# Patient Record
Sex: Female | Born: 1965 | Race: Black or African American | Hispanic: No | Marital: Single | Smoking: Current some day smoker
Health system: Southern US, Community
[De-identification: ages and names within clinical notes are randomized; demographics above are authoritative.]

---

## 2001-04-26 ENCOUNTER — Emergency Department (HOSPITAL_COMMUNITY): Admission: EM | Admit: 2001-04-26 | Discharge: 2001-04-26 | Payer: Self-pay | Admitting: Emergency Medicine

## 2001-10-27 ENCOUNTER — Emergency Department (HOSPITAL_COMMUNITY): Admission: EM | Admit: 2001-10-27 | Discharge: 2001-10-27 | Payer: Self-pay | Admitting: Emergency Medicine

## 2001-10-29 ENCOUNTER — Emergency Department (HOSPITAL_COMMUNITY): Admission: EM | Admit: 2001-10-29 | Discharge: 2001-10-29 | Payer: Self-pay | Admitting: Emergency Medicine

## 2002-01-12 ENCOUNTER — Encounter: Payer: Self-pay | Admitting: Family Medicine

## 2002-01-12 ENCOUNTER — Ambulatory Visit (HOSPITAL_COMMUNITY): Admission: RE | Admit: 2002-01-12 | Discharge: 2002-01-12 | Payer: Self-pay | Admitting: Family Medicine

## 2002-02-06 ENCOUNTER — Encounter: Payer: Self-pay | Admitting: Family Medicine

## 2002-02-06 ENCOUNTER — Ambulatory Visit (HOSPITAL_COMMUNITY): Admission: RE | Admit: 2002-02-06 | Discharge: 2002-02-06 | Payer: Self-pay | Admitting: Family Medicine

## 2002-05-21 ENCOUNTER — Emergency Department (HOSPITAL_COMMUNITY): Admission: EM | Admit: 2002-05-21 | Discharge: 2002-05-21 | Payer: Self-pay | Admitting: Emergency Medicine

## 2002-07-03 ENCOUNTER — Ambulatory Visit (HOSPITAL_COMMUNITY): Admission: RE | Admit: 2002-07-03 | Discharge: 2002-07-03 | Payer: Self-pay | Admitting: Family Medicine

## 2002-07-03 ENCOUNTER — Encounter: Payer: Self-pay | Admitting: Family Medicine

## 2003-10-29 ENCOUNTER — Emergency Department (HOSPITAL_COMMUNITY): Admission: EM | Admit: 2003-10-29 | Discharge: 2003-10-29 | Payer: Self-pay | Admitting: Emergency Medicine

## 2006-02-18 ENCOUNTER — Emergency Department (HOSPITAL_COMMUNITY): Admission: EM | Admit: 2006-02-18 | Discharge: 2006-02-18 | Payer: Self-pay | Admitting: Emergency Medicine

## 2010-07-13 ENCOUNTER — Encounter: Payer: Self-pay | Admitting: Family Medicine

## 2010-08-11 ENCOUNTER — Other Ambulatory Visit (HOSPITAL_COMMUNITY): Payer: Self-pay | Admitting: Family Medicine

## 2010-08-11 DIAGNOSIS — Z139 Encounter for screening, unspecified: Secondary | ICD-10-CM

## 2010-08-12 ENCOUNTER — Encounter (HOSPITAL_COMMUNITY): Payer: Self-pay

## 2013-04-22 ENCOUNTER — Encounter (HOSPITAL_COMMUNITY): Payer: Self-pay | Admitting: Emergency Medicine

## 2013-04-22 ENCOUNTER — Emergency Department (INDEPENDENT_AMBULATORY_CARE_PROVIDER_SITE_OTHER)
Admission: EM | Admit: 2013-04-22 | Discharge: 2013-04-22 | Disposition: A | Discharge status: Home / Self Care | Payer: Self-pay | Source: Home / Self Care | Attending: Emergency Medicine | Admitting: Emergency Medicine

## 2013-04-22 DIAGNOSIS — S39012A Strain of muscle, fascia and tendon of lower back, initial encounter: Secondary | ICD-10-CM

## 2013-04-22 DIAGNOSIS — S161XXA Strain of muscle, fascia and tendon at neck level, initial encounter: Secondary | ICD-10-CM

## 2013-04-22 DIAGNOSIS — S139XXA Sprain of joints and ligaments of unspecified parts of neck, initial encounter: Secondary | ICD-10-CM

## 2013-04-22 MED ORDER — HYDROCODONE-ACETAMINOPHEN 5-325 MG PO TABS: ORAL_TABLET | ORAL | Status: DC

## 2013-04-22 MED ORDER — METHOCARBAMOL 500 MG PO TABS: 500.0000 mg | ORAL_TABLET | Freq: Three times a day (TID) | ORAL | Status: DC

## 2013-04-22 MED ORDER — MELOXICAM 15 MG PO TABS: 15.0000 mg | ORAL_TABLET | Freq: Every day | ORAL | Status: DC
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
## 2013-04-22 NOTE — ED Notes (Signed)
Cervical Collar Applied

## 2015-11-14 ENCOUNTER — Encounter: Payer: Self-pay | Admitting: Family Medicine

## 2015-11-15 ENCOUNTER — Encounter: Payer: Self-pay | Admitting: Family Medicine

## 2015-11-15 ENCOUNTER — Ambulatory Visit (INDEPENDENT_AMBULATORY_CARE_PROVIDER_SITE_OTHER): Payer: BLUE CROSS/BLUE SHIELD | Admitting: Family Medicine

## 2015-11-15 VITALS — BP 110/64 | HR 70 | Temp 97.9°F | Resp 14 | Ht 64.0 in | Wt 164.0 lb

## 2015-11-15 DIAGNOSIS — S161XXA Strain of muscle, fascia and tendon at neck level, initial encounter: Secondary | ICD-10-CM

## 2015-11-15 DIAGNOSIS — Z0000 Encounter for general adult medical examination without abnormal findings: Secondary | ICD-10-CM

## 2015-11-15 LAB — LIPID PANEL
CHOL/HDL RATIO: 4.7 ratio (ref <=5.0)
Cholesterol: 200 mg/dL (ref 125–200)
HDL: 43 mg/dL — ABNORMAL LOW (ref >=46)
LDL Cholesterol: 139 mg/dL — ABNORMAL HIGH (ref <130)
Triglycerides: 90 mg/dL (ref <150)
VLDL: 18 mg/dL (ref <30)

## 2015-11-15 LAB — COMPREHENSIVE METABOLIC PANEL
ALK PHOS: 61 U/L (ref 33–130)
ALT: 9 U/L (ref 6–29)
AST: 15 U/L (ref 10–35)
Albumin: 4.3 g/dL (ref 3.6–5.1)
BUN: 18 mg/dL (ref 7–25)
CHLORIDE: 105 mmol/L (ref 98–110)
CO2: 27 mmol/L (ref 20–31)
Calcium: 9.1 mg/dL (ref 8.6–10.4)
Creat: 0.88 mg/dL (ref 0.50–1.05)
GLUCOSE: 88 mg/dL (ref 70–99)
Potassium: 4.5 mmol/L (ref 3.5–5.3)
Sodium: 140 mmol/L (ref 135–146)
Total Bilirubin: 0.4 mg/dL (ref 0.2–1.2)
Total Protein: 6.7 g/dL (ref 6.1–8.1)

## 2015-11-15 LAB — CBC WITH DIFFERENTIAL/PLATELET
BASOS PCT: 0 %
Basophils Absolute: 0 cells/uL (ref 0–200)
EOS PCT: 3 %
Eosinophils Absolute: 216 cells/uL (ref 15–500)
HCT: 41 % (ref 35.0–45.0)
Hemoglobin: 13.6 g/dL (ref 12.0–15.0)
Lymphocytes Relative: 32 %
Lymphs Abs: 2304 cells/uL (ref 850–3900)
MCH: 30.7 pg (ref 27.0–33.0)
MCHC: 33.2 g/dL (ref 32.0–36.0)
MCV: 92.6 fL (ref 80.0–100.0)
MONOS PCT: 9 %
MPV: 10.1 fL (ref 7.5–12.5)
Monocytes Absolute: 648 cells/uL (ref 200–950)
Neutro Abs: 4032 cells/uL (ref 1500–7800)
Neutrophils Relative %: 56 %
PLATELETS: 250 10*3/uL (ref 140–400)
RBC: 4.43 MIL/uL (ref 3.80–5.10)
RDW: 13.5 % (ref 11.0–15.0)
WBC: 7.2 10*3/uL (ref 3.8–10.8)

## 2015-11-15 LAB — TSH: TSH: 0.65 m[IU]/L

## 2015-11-15 MED ORDER — CYCLOBENZAPRINE HCL 5 MG PO TABS: 5.0000 mg | ORAL_TABLET | Freq: Every day | ORAL | Status: DC

## 2015-11-15 NOTE — Progress Notes (Signed)
Patient ID: Kayla JamesWanda W Woods, female   DOB: Jul 05, 1965, 50 y.o.   MRN: [id number]   Subjective:    Patient ID: Kayla JamesWanda W Woods, female    DOB: Jul 05, 1965, 50 y.o.   MRN: [id number]  Patient presents for New Patient~ Establish Care  Pt here for CPE to establish care Previous PCP Dr. Sudie BaileyKnowlton  No known past medical history, no meds She has had mild headache and neck discomfort, after she had some N/V illness she had HA, HA resolved now has some pain in side of neck, it resolves with motrin. She denies tingling numbness of fingertips, no injury to neck Note does not like prescription meds She gets low back pain/spasm from repetitive movements at work, no radiation  Overdue PAP, MAMMO, COLONOSCOPY, Declines immunizations States TDAP in 2008  She does smoke marijuana regulary feels this helps her mood, states all her family members are on mental pills and rather smokes  Lives along 2 adult children     Review Of Systems:  GEN- denies fatigue, fever, weight loss,weakness, recent illness HEENT- denies eye drainage, change in vision, nasal discharge, CVS- denies chest pain, palpitations RESP- denies SOB, cough, wheeze ABD- denies N/V, change in stools, abd pain GU- denies dysuria, hematuria, dribbling, incontinence MSK- + joint pain, muscle aches, injury Neuro- denies headache, dizziness, syncope, seizure activity       Objective:    BP 110/64 mmHg  Pulse 70  Temp(Src) 97.9 F (36.6 C) (Oral)  Resp 14  Ht 5\' 4"  (1.626 m)  Wt 164 lb (74.39 kg)  BMI 28.14 kg/m2  LMP 08/18/2015 GEN- NAD, alert and oriented x3 HEENT- PERRL, EOMI, non injected sclera, pink conjunctiva, MMM, oropharynx clear Neck- Supple, no thyromegaly, C spine NT, mild TTP along SCM region right side, FROM neck, neg spurlings  CVS- RRR, no murmur RESP-CTAB ABD-NABS,soft,NT,ND MSK- Lumbar spine NT, no spasm, good ROM, neg SLR  Strength equal bilat upper and lower ext  Psych- normal affect and mood  EXT- No  edema Pulses- Radial, DP- 2+        Assessment & Plan:      Problem List Items Addressed This Visit    None    Visit Diagnoses    Routine general medical examination at a health care facility    -  Primary    CPE done, fasting labs today, return for PAP Smear, she will do cologuard. Discussed need to cut out marijuana use     Relevant Orders    CBC with Differential/Platelet (Completed)    Comprehensive metabolic panel (Completed)    Lipid panel (Completed)    TSH (Completed)    Neck strain, initial encounter        more MSK strain baed on exam, okay to use NSAIDS, also given muscle relaxer       Note: This dictation was prepared with Dragon dictation along with smaller phrase technology. Any transcriptional errors that result from this process are unintentional.

## 2015-11-15 NOTE — Patient Instructions (Addendum)
I recommend eye visit once a year I recommend dental visit every 6 months Goal is to  Exercise 30 minutes 5 days a week We will send a letter with lab results  Release of records Dr. Sudie BaileyKnowlton  Schedule PAP Smear within the next 2 months  Use the flexeril as needed

## 2015-11-22 ENCOUNTER — Encounter: Payer: Self-pay | Admitting: *Deleted

## 2015-12-12 LAB — COLOGUARD: Cologuard: NEGATIVE

## 2016-01-15 ENCOUNTER — Encounter: Payer: BLUE CROSS/BLUE SHIELD | Admitting: Family Medicine

## 2016-08-06 ENCOUNTER — Encounter (HOSPITAL_COMMUNITY): Payer: Self-pay | Admitting: Family Medicine

## 2016-08-06 ENCOUNTER — Ambulatory Visit (HOSPITAL_COMMUNITY)
Admission: EM | Admit: 2016-08-06 | Discharge: 2016-08-06 | Disposition: A | Discharge status: Home / Self Care | Payer: BLUE CROSS/BLUE SHIELD | Attending: Internal Medicine | Admitting: Internal Medicine

## 2016-08-06 DIAGNOSIS — K529 Noninfective gastroenteritis and colitis, unspecified: Secondary | ICD-10-CM

## 2016-08-06 DIAGNOSIS — R112 Nausea with vomiting, unspecified: Secondary | ICD-10-CM

## 2016-08-06 DIAGNOSIS — R197 Diarrhea, unspecified: Secondary | ICD-10-CM | POA: Diagnosis not present

## 2016-08-06 MED ORDER — ONDANSETRON 4 MG PO TBDP: 4.0000 mg | ORAL_TABLET | Freq: Three times a day (TID) | ORAL | 0 refills | Status: DC | PRN

## 2016-08-06 MED ORDER — ONDANSETRON 4 MG PO TBDP
4.0000 mg | ORAL_TABLET | Freq: Once | ORAL | Status: AC
  Administered 2016-08-06: 4 mg via ORAL

## 2016-08-06 MED ORDER — ONDANSETRON 4 MG PO TBDP
ORAL_TABLET | ORAL | Status: AC
  Filled 2016-08-06: qty 1

## 2016-08-06 NOTE — ED Triage Notes (Signed)
Pt here for N,V,D that started around midnight.

## 2016-08-06 NOTE — ED Provider Notes (Signed)
CSN: [id number]     Arrival date & time 08/06/16  1134 History   First MD Initiated Contact with Patient 08/06/16 1229     Chief Complaint  Patient presents with  . Emesis  . Diarrhea   (Consider location/radiation/quality/duration/timing/severity/associated sxs/prior Treatment) Patient c/o NVD since last night and c/o weakness.     The history is provided by the patient.  Emesis  Severity:  Moderate Duration:  12 hours Timing:  Constant Number of daily episodes:  2 Quality:  Stomach contents Able to tolerate:  Liquids How soon after eating does vomiting occur:  5 minutes Progression:  Worsening Chronicity:  New Recent urination:  Normal Relieved by:  Nothing Worsened by:  Nothing Ineffective treatments:  None tried Associated symptoms: diarrhea   Diarrhea  Associated symptoms: vomiting     History reviewed. No pertinent past medical history. History reviewed. No pertinent surgical history. Family History  Problem Relation Age of Onset  . Mental illness Sister     Bipolar   . Mental illness Father     commited suicide  . Heart disease Brother   . Diabetes Maternal Grandfather   . Hypertension Maternal Grandfather   . Multiple sclerosis Daughter    Social History  Substance Use Topics  . Smoking status: Current Some Day Smoker    Types: Cigarettes  . Smokeless tobacco: Never Used  . Alcohol use Yes     Comment: occasionally   OB History    No data available     Review of Systems  Constitutional: Positive for fatigue.  HENT: Negative.   Eyes: Negative.   Respiratory: Negative.   Cardiovascular: Negative.   Gastrointestinal: Positive for diarrhea and vomiting.  Endocrine: Negative.   Genitourinary: Negative.   Musculoskeletal: Negative.   Allergic/Immunologic: Negative.   Neurological: Negative.   Hematological: Negative.     Allergies  Patient has no known allergies.  Home Medications   Prior to Admission medications   Medication Sig Start  Date End Date Taking? Authorizing Provider  cyclobenzaprine (FLEXERIL) 5 MG tablet Take 1 tablet (5 mg total) by mouth at bedtime. 11/15/15   Salley Scarlet, MD  ibuprofen (ADVIL,MOTRIN) 200 MG tablet Take 400 mg by mouth every 6 (six) hours as needed.    Historical Provider, MD  ondansetron (ZOFRAN ODT) 4 MG disintegrating tablet Take 1 tablet (4 mg total) by mouth every 8 (eight) hours as needed for nausea or vomiting. 08/06/16   Deatra Canter, FNP   Meds Ordered and Administered this Visit   Medications  ondansetron (ZOFRAN-ODT) disintegrating tablet 4 mg (4 mg Oral Given 08/06/16 1233)    BP 128/77   Pulse 68   Temp 97.5 F (36.4 C)   Resp 18   SpO2 98%  No data found.   Physical Exam  Constitutional: She appears well-developed and well-nourished.  HENT:  Head: Normocephalic.  Right Ear: External ear normal.  Left Ear: External ear normal.  Mouth/Throat: Oropharynx is clear and moist.  Eyes: Conjunctivae and EOM are normal. Pupils are equal, round, and reactive to light.  Neck: Normal range of motion. Neck supple.  Cardiovascular: Normal rate, regular rhythm and normal heart sounds.   Pulmonary/Chest: Effort normal and breath sounds normal.  Abdominal: Soft. Bowel sounds are normal.  Nursing note and vitals reviewed.   Urgent Care Course     Procedures (including critical care time)  Labs Review Labs Reviewed - No data to display  Imaging Review No results found.   Visual  Acuity Review  Right Eye Distance:   Left Eye Distance:   Bilateral Distance:    Right Eye Near:   Left Eye Near:    Bilateral Near:         MDM   1. Nausea vomiting and diarrhea   2. Acute gastroenteritis    Zofran ODT 4mg  here in clinic Zofran ODT 4mg  one po tid prn #21 Push po fluids, rest, tylenol and motrin otc prn as directed for fever, arthralgias, and myalgias.  Follow up prn if sx's continue or persist.    Deatra CanterWilliam J Saisha Hogue, FNP 08/06/16 1324

## 2016-08-07 ENCOUNTER — Ambulatory Visit (HOSPITAL_COMMUNITY)
Admission: EM | Admit: 2016-08-07 | Discharge: 2016-08-07 | Discharge status: Absent without leave | Payer: BLUE CROSS/BLUE SHIELD

## 2016-08-07 ENCOUNTER — Encounter (HOSPITAL_COMMUNITY): Payer: Self-pay | Admitting: Nurse Practitioner

## 2016-08-07 ENCOUNTER — Emergency Department (HOSPITAL_COMMUNITY): Payer: BLUE CROSS/BLUE SHIELD

## 2016-08-07 ENCOUNTER — Emergency Department (HOSPITAL_COMMUNITY)
Admission: EM | Admit: 2016-08-07 | Discharge: 2016-08-07 | Disposition: A | Discharge status: Home / Self Care | Payer: BLUE CROSS/BLUE SHIELD | Attending: Emergency Medicine | Admitting: Emergency Medicine

## 2016-08-07 DIAGNOSIS — F17210 Nicotine dependence, cigarettes, uncomplicated: Secondary | ICD-10-CM | POA: Diagnosis not present

## 2016-08-07 DIAGNOSIS — R109 Unspecified abdominal pain: Secondary | ICD-10-CM | POA: Diagnosis present

## 2016-08-07 DIAGNOSIS — K529 Noninfective gastroenteritis and colitis, unspecified: Secondary | ICD-10-CM | POA: Diagnosis not present

## 2016-08-07 LAB — COMPREHENSIVE METABOLIC PANEL
ALK PHOS: 73 U/L (ref 38–126)
ALT: 23 U/L (ref 14–54)
AST: 42 U/L — AB (ref 15–41)
Albumin: 4.7 g/dL (ref 3.5–5.0)
Anion gap: 15 (ref 5–15)
BILIRUBIN TOTAL: 1 mg/dL (ref 0.3–1.2)
BUN: 15 mg/dL (ref 6–20)
CO2: 21 mmol/L — ABNORMAL LOW (ref 22–32)
Calcium: 9.9 mg/dL (ref 8.9–10.3)
Chloride: 100 mmol/L — ABNORMAL LOW (ref 101–111)
Creatinine, Ser: 0.75 mg/dL (ref 0.44–1.00)
GFR calc Af Amer: >60 mL/min (ref >60)
GLUCOSE: 103 mg/dL — AB (ref 65–99)
Potassium: 3.5 mmol/L (ref 3.5–5.1)
Sodium: 136 mmol/L (ref 135–145)
TOTAL PROTEIN: 8 g/dL (ref 6.5–8.1)

## 2016-08-07 LAB — URINALYSIS, ROUTINE W REFLEX MICROSCOPIC
Bilirubin Urine: NEGATIVE
GLUCOSE, UA: 50 mg/dL — AB
Ketones, ur: 20 mg/dL — AB
NITRITE: NEGATIVE
PH: 5 (ref 5.0–8.0)
Protein, ur: 100 mg/dL — AB
SPECIFIC GRAVITY, URINE: 1.031 — AB (ref 1.005–1.030)

## 2016-08-07 LAB — CBC
HCT: 46.6 % — ABNORMAL HIGH (ref 36.0–46.0)
Hemoglobin: 16.1 g/dL — ABNORMAL HIGH (ref 12.0–15.0)
MCH: 31.9 pg (ref 26.0–34.0)
MCHC: 34.5 g/dL (ref 30.0–36.0)
MCV: 92.3 fL (ref 78.0–100.0)
PLATELETS: 245 10*3/uL (ref 150–400)
RBC: 5.05 MIL/uL (ref 3.87–5.11)
RDW: 13.5 % (ref 11.5–15.5)
WBC: 10.1 10*3/uL (ref 4.0–10.5)

## 2016-08-07 LAB — LIPASE, BLOOD: Lipase: 62 U/L — ABNORMAL HIGH (ref 11–51)

## 2016-08-07 MED ORDER — METRONIDAZOLE 500 MG PO TABS: 500.0000 mg | ORAL_TABLET | Freq: Two times a day (BID) | ORAL | 0 refills | Status: DC

## 2016-08-07 MED ORDER — SODIUM CHLORIDE 0.9 % IV SOLN
INTRAVENOUS | Status: DC
  Administered 2016-08-07: 20:00:00 via INTRAVENOUS

## 2016-08-07 MED ORDER — HYDROCODONE-ACETAMINOPHEN 5-325 MG PO TABS: 2.0000 | ORAL_TABLET | ORAL | 0 refills | Status: DC | PRN

## 2016-08-07 MED ORDER — IOPAMIDOL (ISOVUE-300) INJECTION 61%
INTRAVENOUS | Status: AC
  Administered 2016-08-07: 100 mL
  Filled 2016-08-07: qty 100

## 2016-08-07 MED ORDER — CIPROFLOXACIN HCL 500 MG PO TABS: 500.0000 mg | ORAL_TABLET | Freq: Two times a day (BID) | ORAL | 0 refills | Status: DC

## 2016-08-07 NOTE — ED Notes (Signed)
Pt is refusing blood work at this time. She says she just wants a shot for the pain and would prefer to see the doctor before blood work is drawn.

## 2016-08-07 NOTE — ED Notes (Signed)
Pt departed in NAD.  

## 2016-08-07 NOTE — ED Notes (Signed)
Patient transported to CT 

## 2016-08-07 NOTE — ED Triage Notes (Signed)
Pt presents with c/o abd pain. The pain began on Wednesday night. She describes as an irritation in her LLQ. She reports sweats, chills, nausea, vomiting, decreased urinary output. She denies constipation or diarrhea. [facility] and given nausea medication which relieved her nausea but she is still having the pain.

## 2016-08-07 NOTE — ED Provider Notes (Signed)
[facility] Provider Note   CSN: [id number] Arrival date & time: 08/07/16  1605     History   Chief Complaint Chief Complaint  Patient presents with  . Abdominal Pain    HPI Kayla Woods is a 51 y.o. female.  51 year old female presents with several days of left lower quadrant abdominal pain. Seen at urgent care and diagnosed with gastroenteritis but she continues to note sharp pain that radiates to her pelvis without vaginal bleeding or discharge. Denies any urinary symptoms. She's had slight diarrhea but denies any current vomiting or fever or chills. Pain is been persistent and worsening ambulation better with rest. No treatment use prior to arrival.      History reviewed. No pertinent past medical history.  There are no active problems to display for this patient.   History reviewed. No pertinent surgical history.  OB History    No data available       Home Medications    Prior to Admission medications   Medication Sig Start Date End Date Taking? Authorizing Provider  cyclobenzaprine (FLEXERIL) 5 MG tablet Take 1 tablet (5 mg total) by mouth at bedtime. 11/15/15   Salley Scarlet, MD  ibuprofen (ADVIL,MOTRIN) 200 MG tablet Take 400 mg by mouth every 6 (six) hours as needed.    Historical Provider, MD  ondansetron (ZOFRAN ODT) 4 MG disintegrating tablet Take 1 tablet (4 mg total) by mouth every 8 (eight) hours as needed for nausea or vomiting. 08/06/16   Deatra Canter, FNP    Family History Family History  Problem Relation Age of Onset  . Mental illness Sister     Bipolar   . Mental illness Father     commited suicide  . Heart disease Brother   . Diabetes Maternal Grandfather   . Hypertension Maternal Grandfather   . Multiple sclerosis Daughter     Social History Social History  Substance Use Topics  . Smoking status: Current Some Day Smoker    Types: Cigarettes  . Smokeless tobacco: Never Used  . Alcohol use Yes     Comment:  occasionally     Allergies   Patient has no known allergies.   Review of Systems Review of Systems  All other systems reviewed and are negative.    Physical Exam Updated Vital Signs BP 141/75   Pulse 63   Temp 98.6 F (37 C) (Oral)   Resp 16   SpO2 100%   Physical Exam  Constitutional: She is oriented to person, place, and time. She appears well-developed and well-nourished.  Non-toxic appearance. No distress.  HENT:  Head: Normocephalic and atraumatic.  Eyes: Conjunctivae, EOM and lids are normal. Pupils are equal, round, and reactive to light.  Neck: Normal range of motion. Neck supple. No tracheal deviation present. No thyroid mass present.  Cardiovascular: Normal rate, regular rhythm and normal heart sounds.  Exam reveals no gallop.   No murmur heard. Pulmonary/Chest: Effort normal and breath sounds normal. No stridor. No respiratory distress. She has no decreased breath sounds. She has no wheezes. She has no rhonchi. She has no rales.  Abdominal: Soft. Normal appearance and bowel sounds are normal. She exhibits no distension. There is tenderness in the left lower quadrant. There is no rebound and no CVA tenderness.    Musculoskeletal: Normal range of motion. She exhibits no edema or tenderness.  Neurological: She is alert and oriented to person, place, and time. She has normal strength. No cranial nerve deficit or sensory  deficit. GCS eye subscore is 4. GCS verbal subscore is 5. GCS motor subscore is 6.  Skin: Skin is warm and dry. No abrasion and no rash noted.  Psychiatric: She has a normal mood and affect. Her speech is normal and behavior is normal.  Nursing note and vitals reviewed.    ED Treatments / Results  Labs (all labs ordered are listed, but only abnormal results are displayed) Labs Reviewed  COMPREHENSIVE METABOLIC PANEL  CBC  URINALYSIS, ROUTINE W REFLEX MICROSCOPIC  LIPASE, BLOOD    EKG  EKG Interpretation None       Radiology No  results found.  Procedures Procedures (including critical care time)  Medications Ordered in ED Medications  0.9 %  sodium chloride infusion ( Intravenous New Bag/Given 08/07/16 1946)     Initial Impression / Assessment and Plan / ED Course  I have reviewed the triage vital signs and the nursing notes.  Pertinent labs & imaging results that were available during my care of the patient were reviewed by me and considered in my medical decision making (see chart for details).     Patient's CT scan consistent with colitis. Will place on Cipro and Flagyl for colitis  Final Clinical Impressions(s) / ED Diagnoses   Final diagnoses:  None    New Prescriptions New Prescriptions   No medications on file     Lorre NickAnthony Rosaland Shiffman, MD 08/07/16 2210

## 2017-01-01 ENCOUNTER — Encounter: Payer: Self-pay | Admitting: *Deleted

## 2017-03-08 ENCOUNTER — Other Ambulatory Visit (HOSPITAL_COMMUNITY): Payer: Self-pay | Admitting: Family Medicine

## 2017-03-08 DIAGNOSIS — Z1231 Encounter for screening mammogram for malignant neoplasm of breast: Secondary | ICD-10-CM

## 2017-03-11 ENCOUNTER — Ambulatory Visit (HOSPITAL_COMMUNITY)
Admission: RE | Admit: 2017-03-11 | Discharge: 2017-03-11 | Disposition: A | Discharge status: Home / Self Care | Payer: BLUE CROSS/BLUE SHIELD | Source: Ambulatory Visit | Attending: Family Medicine | Admitting: Family Medicine

## 2017-03-11 DIAGNOSIS — R921 Mammographic calcification found on diagnostic imaging of breast: Secondary | ICD-10-CM | POA: Diagnosis not present

## 2017-03-11 DIAGNOSIS — N6310 Unspecified lump in the right breast, unspecified quadrant: Secondary | ICD-10-CM | POA: Diagnosis not present

## 2017-03-11 DIAGNOSIS — R928 Other abnormal and inconclusive findings on diagnostic imaging of breast: Secondary | ICD-10-CM | POA: Insufficient documentation

## 2017-03-11 DIAGNOSIS — Z1231 Encounter for screening mammogram for malignant neoplasm of breast: Secondary | ICD-10-CM | POA: Insufficient documentation

## 2017-03-12 ENCOUNTER — Ambulatory Visit (HOSPITAL_COMMUNITY): Payer: BLUE CROSS/BLUE SHIELD

## 2017-03-12 ENCOUNTER — Inpatient Hospital Stay (HOSPITAL_COMMUNITY)
Admission: RE | Admit: 2017-03-12 | Discharge: 2017-03-12 | Disposition: A | Discharge status: Home / Self Care | Payer: BLUE CROSS/BLUE SHIELD | Source: Ambulatory Visit | Attending: Family Medicine | Admitting: Family Medicine

## 2017-03-17 ENCOUNTER — Other Ambulatory Visit (HOSPITAL_COMMUNITY): Payer: Self-pay | Admitting: Family Medicine

## 2017-03-17 DIAGNOSIS — N6310 Unspecified lump in the right breast, unspecified quadrant: Secondary | ICD-10-CM

## 2017-03-17 DIAGNOSIS — R921 Mammographic calcification found on diagnostic imaging of breast: Secondary | ICD-10-CM

## 2017-03-23 ENCOUNTER — Encounter (HOSPITAL_COMMUNITY): Payer: BLUE CROSS/BLUE SHIELD

## 2017-03-23 ENCOUNTER — Encounter (HOSPITAL_COMMUNITY): Payer: Self-pay

## 2020-07-24 ENCOUNTER — Other Ambulatory Visit: Payer: Self-pay

## 2020-07-24 ENCOUNTER — Ambulatory Visit (HOSPITAL_COMMUNITY)
Admission: EM | Admit: 2020-07-24 | Discharge: 2020-07-24 | Disposition: A | Discharge status: Home / Self Care | Payer: BLUE CROSS/BLUE SHIELD | Attending: Emergency Medicine | Admitting: Emergency Medicine

## 2020-07-24 DIAGNOSIS — R079 Chest pain, unspecified: Secondary | ICD-10-CM

## 2020-07-24 DIAGNOSIS — H538 Other visual disturbances: Secondary | ICD-10-CM

## 2020-07-24 NOTE — ED Triage Notes (Signed)
Pt reports Lt sided CP for 4 days that is intermittent . Pt also reports vision in Lt eye id foggy  for the past 4 weeks.

## 2020-07-24 NOTE — ED Provider Notes (Signed)
[facility]    CSN: [id number] Arrival date & time: 07/24/20  1352      History   Chief Complaint Chief Complaint  Patient presents with  . Chest Pain    HPI Kayla Woods is a 55 y.o. female.   Patient presents with intermittent left upper chest pain x4 days.  No chest pain currently.  She states the first episode started when she was sitting in her car and resolved spontaneously.  Latest episode occurred this morning.  Each episode lasts 2-3 minutes and resolves without intervention.  She also reports 1 month history of blurred vision in her left eye which she attributes to COVID.  She denies dizziness, weakness, numbness, shortness of breath, nausea, vomiting, diaphoresis, or other symptoms.  Treatment attempted at home with aspirin.  She denies pertinent medical history.  The history is provided by the patient.    No past medical history on file.  There are no problems to display for this patient.   No past surgical history on file.  OB History   No obstetric history on file.      Home Medications    Prior to Admission medications   Medication Sig Start Date End Date Taking? Authorizing Provider  ciprofloxacin (CIPRO) 500 MG tablet Take 1 tablet (500 mg total) by mouth 2 (two) times daily. 08/07/16   Lorre Nick, MD  cyclobenzaprine (FLEXERIL) 5 MG tablet Take 1 tablet (5 mg total) by mouth at bedtime. Patient not taking: Reported on 08/07/2016 11/15/15   Salley Scarlet, MD  HYDROcodone-acetaminophen (NORCO/VICODIN) 5-325 MG tablet Take 2 tablets by mouth every 4 (four) hours as needed. 08/07/16   Lorre Nick, MD  ibuprofen (ADVIL,MOTRIN) 200 MG tablet Take 400 mg by mouth every 6 (six) hours as needed.    [provider]  metroNIDAZOLE (FLAGYL) 500 MG tablet Take 1 tablet (500 mg total) by mouth 2 (two) times daily. 08/07/16   Lorre Nick, MD  ondansetron (ZOFRAN ODT) 4 MG disintegrating tablet Take 1 tablet (4 mg total) by mouth every 8  (eight) hours as needed for nausea or vomiting. 08/06/16   Deatra Canter, FNP    Family History Family History  Problem Relation Age of Onset  . Mental illness Sister        Bipolar   . Mental illness Father        commited suicide  . Heart disease Brother   . Diabetes Maternal Grandfather   . Hypertension Maternal Grandfather   . Multiple sclerosis Daughter     Social History Social History   Tobacco Use  . Smoking status: Current Some Day Smoker    Types: Cigarettes  . Smokeless tobacco: Never Used  Substance Use Topics  . Alcohol use: Yes    Comment: occasionally  . Drug use: Yes    Types: Marijuana     Allergies   Patient has no known allergies.   Review of Systems Review of Systems  Constitutional: Negative for chills and fever.  HENT: Negative for ear pain and sore throat.   Eyes: Positive for visual disturbance. Negative for pain.  Respiratory: Negative for cough and shortness of breath.   Cardiovascular: Positive for chest pain. Negative for palpitations.  Gastrointestinal: Negative for abdominal pain and vomiting.  Genitourinary: Negative for dysuria and hematuria.  Musculoskeletal: Negative for arthralgias and back pain.  Skin: Negative for color change and rash.  Neurological: Negative for syncope, weakness and numbness.  All other systems reviewed and  are negative.    Physical Exam Triage Vital Signs ED Triage Vitals  Enc Vitals Group     BP 07/24/20 1408 136/76     Pulse Rate 07/24/20 1408 64     Resp 07/24/20 1408 16     Temp 07/24/20 1408 98.6 F (37 C)     Temp Source 07/24/20 1408 Oral     SpO2 --      Weight --      Height --      Head Circumference --      Peak Flow --      Pain Score 07/24/20 1404 4     Pain Loc --      Pain Edu? --      Excl. in GC? --    No data found.  Updated Vital Signs BP 136/76 (BP Location: Right Arm)   Pulse 64   Temp 98.6 F (37 C) (Oral)   Resp 16   Visual Acuity Right Eye  Distance: 20/30 with correction Left Eye Distance: 20/40 with correction Bilateral Distance: 20/30  with correction  Right Eye Near:   Left Eye Near:    Bilateral Near:     Physical Exam Vitals and nursing note reviewed.  Constitutional:      General: She is not in acute distress.    Appearance: She is well-developed and well-nourished. She is not ill-appearing.  HENT:     Head: Normocephalic and atraumatic.     Mouth/Throat:     Mouth: Mucous membranes are moist.  Eyes:     Extraocular Movements: Extraocular movements intact.     Conjunctiva/sclera: Conjunctivae normal.     Pupils: Pupils are equal, round, and reactive to light.  Cardiovascular:     Rate and Rhythm: Normal rate and regular rhythm.     Heart sounds: Normal heart sounds.  Pulmonary:     Effort: Pulmonary effort is normal. No respiratory distress.     Breath sounds: Normal breath sounds.  Abdominal:     Palpations: Abdomen is soft.     Tenderness: There is no abdominal tenderness.  Musculoskeletal:        General: No edema.     Cervical back: Neck supple.  Skin:    General: Skin is warm and dry.  Neurological:     General: No focal deficit present.     Mental Status: She is alert and oriented to person, place, and time.     Sensory: No sensory deficit.     Motor: No weakness.     Gait: Gait normal.  Psychiatric:        Mood and Affect: Mood and affect and mood normal.        Behavior: Behavior normal.      UC Treatments / Results  Labs (all labs ordered are listed, but only abnormal results are displayed) Labs Reviewed - No data to display  EKG   Radiology No results found.  Procedures Procedures (including critical care time)  Medications Ordered in UC Medications - No data to display  Initial Impression / Assessment and Plan / UC Course  I have reviewed the triage vital signs and the nursing notes.  Pertinent labs & imaging results that were available during my care of the patient  were reviewed by me and considered in my medical decision making (see chart for details).   Chest pain, blurred vision.  Patient declines transfer to the ED.  EKG shows sinus rhythm, rate 63, no ST elevation, no  previous to compare.  Instructed patient to follow-up with her PCP tomorrow.  Strict precautions for going to the ED discussed.  She agrees to plan of care.   Final Clinical Impressions(s) / UC Diagnoses   Final diagnoses:  Chest pain, unspecified type  Blurred vision     Discharge Instructions     Go to the emergency department if you have chest pain or other concerning symptoms.        ED Prescriptions    None     PDMP not reviewed this encounter.   Mickie Bail, NP 07/24/20 [phone]

## 2020-07-24 NOTE — Discharge Instructions (Signed)
Go to the emergency department if you have chest pain or other concerning symptoms.     

## 2020-08-22 ENCOUNTER — Encounter (HOSPITAL_COMMUNITY): Payer: Self-pay

## 2020-08-22 ENCOUNTER — Emergency Department (HOSPITAL_COMMUNITY)
Admission: EM | Admit: 2020-08-22 | Discharge: 2020-08-22 | Disposition: A | Discharge status: Home / Self Care | Payer: Self-pay | Attending: Emergency Medicine | Admitting: Emergency Medicine

## 2020-08-22 ENCOUNTER — Other Ambulatory Visit: Payer: Self-pay

## 2020-08-22 ENCOUNTER — Emergency Department (HOSPITAL_COMMUNITY): Payer: Self-pay

## 2020-08-22 DIAGNOSIS — I726 Aneurysm of vertebral artery: Secondary | ICD-10-CM | POA: Insufficient documentation

## 2020-08-22 DIAGNOSIS — R519 Headache, unspecified: Secondary | ICD-10-CM

## 2020-08-22 DIAGNOSIS — U099 Post covid-19 condition, unspecified: Secondary | ICD-10-CM

## 2020-08-22 DIAGNOSIS — Z8616 Personal history of COVID-19: Secondary | ICD-10-CM | POA: Insufficient documentation

## 2020-08-22 DIAGNOSIS — F17210 Nicotine dependence, cigarettes, uncomplicated: Secondary | ICD-10-CM | POA: Insufficient documentation

## 2020-08-22 LAB — I-STAT CHEM 8, ED
BUN: 16 mg/dL (ref 6–20)
Calcium, Ion: 1.12 mmol/L — ABNORMAL LOW (ref 1.15–1.40)
Chloride: 104 mmol/L (ref 98–111)
Creatinine, Ser: 0.6 mg/dL (ref 0.44–1.00)
Glucose, Bld: 95 mg/dL (ref 70–99)
HCT: 40 % (ref 36.0–46.0)
Hemoglobin: 13.6 g/dL (ref 12.0–15.0)
Potassium: 3.7 mmol/L (ref 3.5–5.1)
Sodium: 141 mmol/L (ref 135–145)
TCO2: 24 mmol/L (ref 22–32)

## 2020-08-22 MED ORDER — IOHEXOL 350 MG/ML SOLN
75.0000 mL | Freq: Once | INTRAVENOUS | Status: AC | PRN
  Administered 2020-08-22: 75 mL via INTRAVENOUS

## 2020-08-22 NOTE — ED Provider Notes (Signed)
[facility] EMERGENCY DEPARTMENT Provider Note   CSN: [id number] Arrival date & time: 08/22/20  0401     History Chief Complaint  Patient presents with  . Headache    Kayla Woods is a 55 y.o. female reports she had COVID 2 months ago present emergency department with abrupt onset of headache.  She reports a headache woke up from sleep around 330 this morning.  She describes a throbbing sensation all across the middle of her head.  She took 800 mg ibuprofen at 0330 and came to the ED. she states she does not normally suffer from headaches or migraines.  However since she was diagnosed with COVID 2 months ago, she describes persistent brain fog and a feeling of "fullness in my head" every day.    Since arriving in the Ed her headache has significantly improved.  It is minimal 2/10 intensity now, throbbing.  She denies photophobia, current neck pain, fevers or chills.  She denies history of sinus infection.  She does report that she feels she had a dental infection in her mouth.  She is unsure about family hx of brain aneurysm specifically but states her aunt had a "brain bleed that needed surgery."   HPI     History reviewed. No pertinent past medical history.  There are no problems to display for this patient.   History reviewed. No pertinent surgical history.   OB History   No obstetric history on file.     Family History  Problem Relation Age of Onset  . Mental illness Sister        Bipolar   . Mental illness Father        commited suicide  . Heart disease Brother   . Diabetes Maternal Grandfather   . Hypertension Maternal Grandfather   . Multiple sclerosis Daughter     Social History   Tobacco Use  . Smoking status: Current Some Day Smoker    Types: Cigarettes  . Smokeless tobacco: Never Used  Substance Use Topics  . Alcohol use: Yes    Comment: occasionally  . Drug use: Yes    Types: Marijuana    Home Medications Prior to  Admission medications   Medication Sig Start Date End Date Taking? Authorizing Provider  ciprofloxacin (CIPRO) 500 MG tablet Take 1 tablet (500 mg total) by mouth 2 (two) times daily. 08/07/16   Lorre Nick, MD  cyclobenzaprine (FLEXERIL) 5 MG tablet Take 1 tablet (5 mg total) by mouth at bedtime. Patient not taking: Reported on 08/07/2016 11/15/15   Salley Scarlet, MD  HYDROcodone-acetaminophen (NORCO/VICODIN) 5-325 MG tablet Take 2 tablets by mouth every 4 (four) hours as needed. 08/07/16   Lorre Nick, MD  ibuprofen (ADVIL,MOTRIN) 200 MG tablet Take 400 mg by mouth every 6 (six) hours as needed.    [provider]  metroNIDAZOLE (FLAGYL) 500 MG tablet Take 1 tablet (500 mg total) by mouth 2 (two) times daily. 08/07/16   Lorre Nick, MD  ondansetron (ZOFRAN ODT) 4 MG disintegrating tablet Take 1 tablet (4 mg total) by mouth every 8 (eight) hours as needed for nausea or vomiting. 08/06/16   Deatra Canter, FNP    Allergies    Patient has no known allergies.  Review of Systems   Review of Systems  Constitutional: Negative for chills and fever.  HENT: Negative for ear pain and sore throat.   Eyes: Negative for pain and visual disturbance.  Respiratory: Negative for cough and shortness of  breath.   Cardiovascular: Negative for chest pain and palpitations.  Gastrointestinal: Negative for abdominal pain and vomiting.  Genitourinary: Negative for dysuria and hematuria.  Musculoskeletal: Negative for arthralgias and myalgias.  Skin: Negative for color change and rash.  Neurological: Positive for headaches. Negative for syncope.  All other systems reviewed and are negative.   Physical Exam Updated Vital Signs BP 132/89   Pulse (!) 59   Temp 97.7 F (36.5 C)   Resp 18   Ht 5\' 5"  (1.651 m)   Wt 70.3 kg   SpO2 99%   BMI 25.79 kg/m   Physical Exam Constitutional:      General: She is not in acute distress. HENT:     Head: Normocephalic and atraumatic.      Comments: Carries left upper tooth without peridental infection Eyes:     General: No visual field deficit.    Conjunctiva/sclera: Conjunctivae normal.     Pupils: Pupils are equal, round, and reactive to light.  Cardiovascular:     Rate and Rhythm: Normal rate and regular rhythm.  Pulmonary:     Effort: Pulmonary effort is normal. No respiratory distress.  Abdominal:     General: There is no distension.     Tenderness: There is no abdominal tenderness.  Musculoskeletal:     Cervical back: Normal range of motion. No rigidity.  Skin:    General: Skin is warm and dry.  Neurological:     General: No focal deficit present.     Mental Status: She is alert. Mental status is at baseline.     GCS: GCS eye subscore is 4. GCS verbal subscore is 5. GCS motor subscore is 6.     Cranial Nerves: Cranial nerves are intact. No cranial nerve deficit, dysarthria or facial asymmetry.     Sensory: Sensation is intact.     Motor: Motor function is intact.  Psychiatric:        Mood and Affect: Mood normal.        Behavior: Behavior normal.     ED Results / Procedures / Treatments   Labs (all labs ordered are listed, but only abnormal results are displayed) Labs Reviewed  I-STAT CHEM 8, ED - Abnormal; Notable for the following components:      Result Value   Calcium, Ion 1.12 (*)    All other components within normal limits    EKG None  Radiology CT Angio Head W or Wo Contrast  Addendum Date: 08/22/2020   ADDENDUM REPORT: 08/22/2020 12:12 ADDENDUM: Findings discussed with Dr. 10/22/2020 at 12:10 PM via telephone. Electronically Signed   By: Renaye Rakers MD   On: 08/22/2020 12:12   Result Date: 08/22/2020 CLINICAL DATA:  Dizziness. Acute headache. Family history of aneurysm. EXAM: CT ANGIOGRAPHY HEAD TECHNIQUE: Multidetector CT imaging of the head was performed using the standard protocol during bolus administration of intravenous contrast. Multiplanar CT image reconstructions and MIPs were  obtained to evaluate the vascular anatomy. CONTRAST:  73mL OMNIPAQUE IOHEXOL 350 MG/ML SOLN COMPARISON:  Oct 29, 2003 FINDINGS: CT HEAD Brain: No evidence of acute infarction, hemorrhage, hydrocephalus, extra-axial collection or mass lesion/mass effect. Skull: No acute fracture. Sinuses: The sphenoid sinus mucosal thickening. Orbits: No acute finding. CTA HEAD Anterior circulation: No significant stenosis, proximal occlusion, or aneurysm. 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 No significant stenosis or  proximal occlusion. Venous sinuses: As permitted by contrast timing, patent. IMPRESSION: 1. No evidence of acute nonvascular intracranial abnormality. No acute hemorrhage. 2. 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 No other intracranial aneurysm identified. Electronically Signed: By: Feliberto Harts MD On: 08/22/2020 11:43    Procedures   Medications Ordered in ED Medications  iohexol (OMNIPAQUE) 350 MG/ML injection 75 mL (75 mLs Intravenous Contrast Given 08/22/20 1119)    ED Course  I have reviewed the triage vital signs and the nursing notes.  Pertinent labs & imaging results that were available during my care of the patient were reviewed by me and considered in my medical decision making (see chart for details).  This patient presents to the Emergency Department with complaint of headache.  This involves an extensive number of treatment options, and is a complaint that carries with it a high risk of complications and morbidity.  The differential diagnosis for headache includes tension type headache vs occipital headache vs migraine vs sinusitis vs intracranial bleed vs long covid vs other  With her family hx of possible aneurysm, we'll proceed to a CTA head  at this point.  I ordered, reviewed, and interpreted labs, including istat chem 8, showing no sig findings. Patient did not want medication for headache pain, reporting it was very minimal I ordered imaging studies which included CTA I independently visualized and interpreted imaging which showed no acute bleeding, 5 mm left vertebral aneurysm   After the interventions stated above, I reevaluated the patient and found that the patient remained clinically stable, headache quite minimal and improved during her stay.  Based on the patient's clinical exam, vital signs, risk factors, and ED testing, I felt that the patient's overall risk of life-threatening emergency such as ICH, meningitis, intracranial mass or tumor was quite low.  I suspect this clinical presentation is most consistent with long covid or tension type headache, but explained to the patient that this evaluation was not a definitive diagnostic workup.  I discussed outpatient follow up with primary care provider, and provided specialist office number on the patient's discharge paper if a referral was deemed necessary.  I discussed return precautions with the patient. I felt the patient was clinically stable for discharge.   Clinical Course as of 08/22/20 1700  Thu Aug 22, 2020  1221 Reviewed CT findings with patient, verberal artery aneurysm.  This may be incidental finding.  There is no report or evidence of rupture or subarachnoid hemorrhage.  Her headache is significantly improved and is very minimal now.  I doubt she is having continued bleed.  We discussed return precautions including sudden worsening headache, vertigo, ataxia, or any other stroke or neurological symptoms.  I will refer her to neurosurgery for follow-up for this aneurysm.  She may need repeat imaging in several weeks or months.  I advised to continue Motrin and Tylenol as needed for pain at home.  Okay for discharge [MT]    Clinical Course User Index [MT] Trifan,  Kermit Balo, MD    Final Clinical Impression(s) / ED Diagnoses Final diagnoses:  Nonintractable headache, unspecified chronicity pattern, unspecified headache type  Long COVID  Aneurysm of left vertebral artery [facility])    Rx / DC Orders ED Discharge Orders    None       Terald Sleeper, MD 08/22/20 1701

## 2020-08-22 NOTE — ED Notes (Signed)
Reviewed discharge instructions with patient. Follow-up care reviewed. Patient verbalized understanding. Patient A&Ox4, VSS, and ambulatory with steady gait upon discharge.  

## 2020-08-22 NOTE — Discharge Instructions (Addendum)
Your CT scan showed that you have an aneurysm in your brain, which means there is a ballooning of one of the blood vessels in the back of the brain.  This is your vertebral artery.  Advised that you call and schedule follow-up appointment with neurosurgeons for this issue.  Call the number above today or tomorrow to ask for the next available appointment.  For pain at home you can continue taking Tylenol and Motrin as needed.  You should look out for signs and symptoms of a rupture or break in your aneurysm.  This would include sudden severe headache, sudden dizziness, vertigo or room spinning, feeling like you are drunk or cannot walk straight, difficulty with speech, arm droop, numbness or weakness of your arms or legs.  If this occurs you should come back immediately to the ER.

## 2020-08-22 NOTE — ED Notes (Signed)
This RN called CT- Istat results not crossing over. Reported results of Creat and BUN- pt is ok for transport to CT

## 2020-08-22 NOTE — ED Triage Notes (Signed)
Pt reports that she woke up tonight with a headache, denies photosensitivity or nausea, neuro intact bilaterally.

## 2021-02-03 ENCOUNTER — Other Ambulatory Visit: Payer: Self-pay | Admitting: Neurosurgery

## 2021-02-03 ENCOUNTER — Other Ambulatory Visit (HOSPITAL_COMMUNITY): Payer: Self-pay | Admitting: Neurosurgery

## 2021-02-03 DIAGNOSIS — I726 Aneurysm of vertebral artery: Secondary | ICD-10-CM

## 2021-02-13 ENCOUNTER — Ambulatory Visit (HOSPITAL_COMMUNITY)
Admission: RE | Admit: 2021-02-13 | Discharge: 2021-02-13 | Disposition: A | Discharge status: Home / Self Care | Payer: Self-pay | Source: Ambulatory Visit | Attending: Neurosurgery | Admitting: Neurosurgery

## 2021-02-13 ENCOUNTER — Ambulatory Visit (HOSPITAL_COMMUNITY): Admission: RE | Admit: 2021-02-13 | Payer: Self-pay | Source: Ambulatory Visit

## 2021-02-13 DIAGNOSIS — I726 Aneurysm of vertebral artery: Secondary | ICD-10-CM | POA: Insufficient documentation

## 2021-02-13 MED ORDER — IOHEXOL 350 MG/ML SOLN
65.0000 mL | Freq: Once | INTRAVENOUS | Status: AC | PRN
  Administered 2021-02-13: 65 mL via INTRAVENOUS

## 2022-10-15 IMAGING — CT CT ANGIO HEAD
2 of 11 series · 8 of 47 positions shown · IV contrast (Omni 300)
Comparison: CT angiogram August 22, 2020.

CLINICAL DATA: History of aneurysm of the vertebral artery.

EXAM:
CT ANGIOGRAPHY HEAD
TECHNIQUE: Multidetector CT imaging of the head was performed using the
standard protocol during bolus administration of intravenous
contrast. Multiplanar CT image reconstructions and MIPs were
obtained to evaluate the vascular anatomy.
CONTRAST:  65mL OMNIPAQUE IOHEXOL 350 MG/ML SOLN

[Series 5: cow 2.0 · axial · 0.40mm/px · z∈[-181,-85]mm · 4 of 82 slices shown]
[im 17/82  brain]
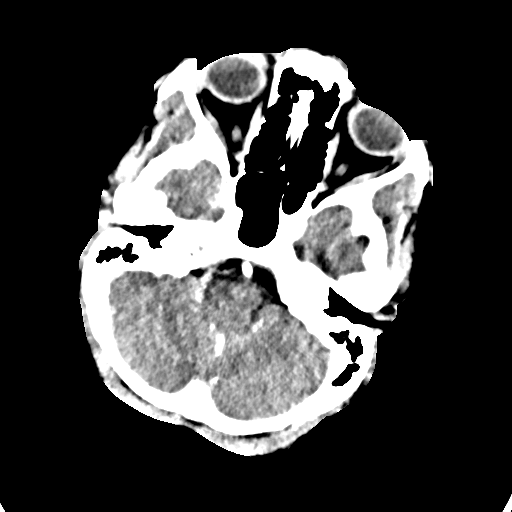
[im 33/82  bone]
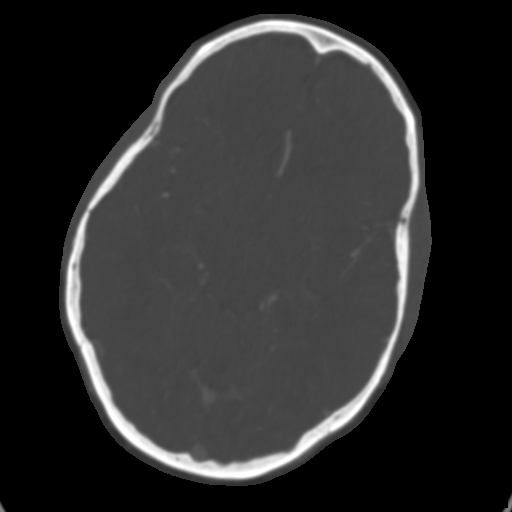
[im 49/82  brain]
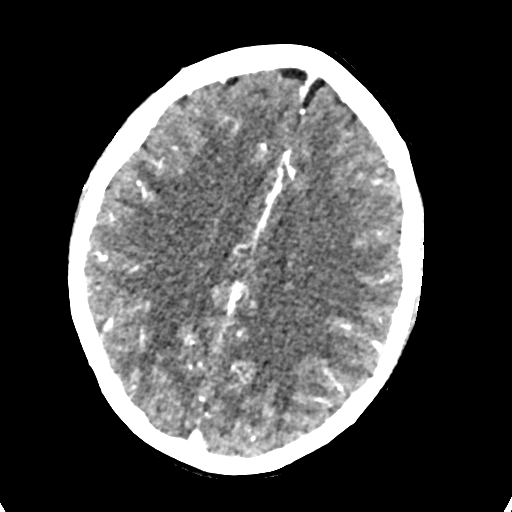
[im 65/82  bone]
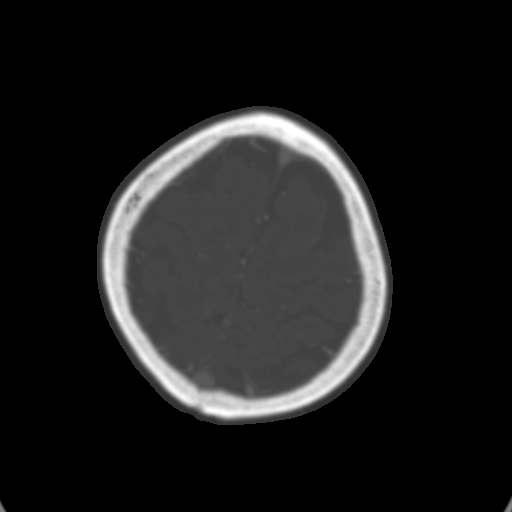

[Series 7: head bone · axial · 0.39mm/px · z∈[-158,-72]mm · 4 of 73 slices shown]
[im 15/73  bone]
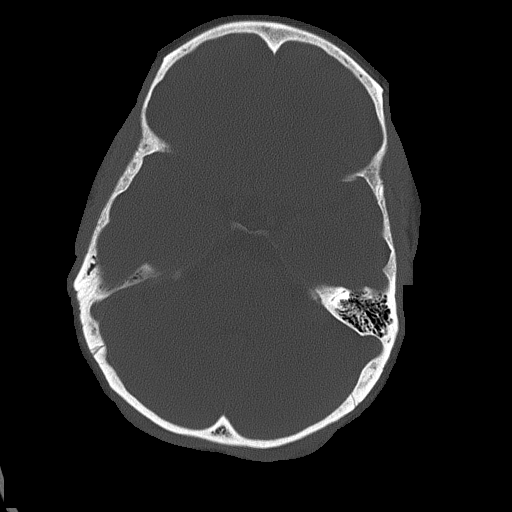
[im 29/73  bone]
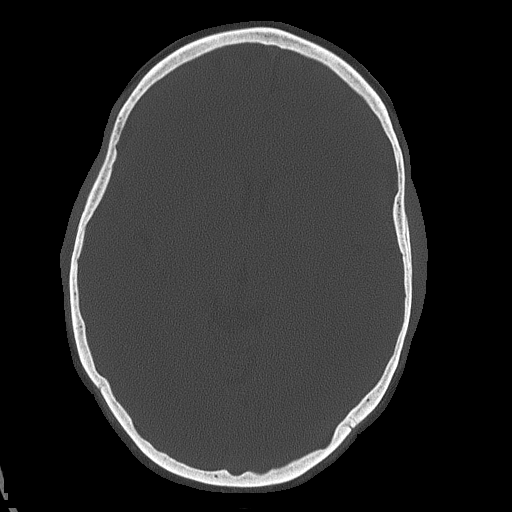
[im 44/73  bone]
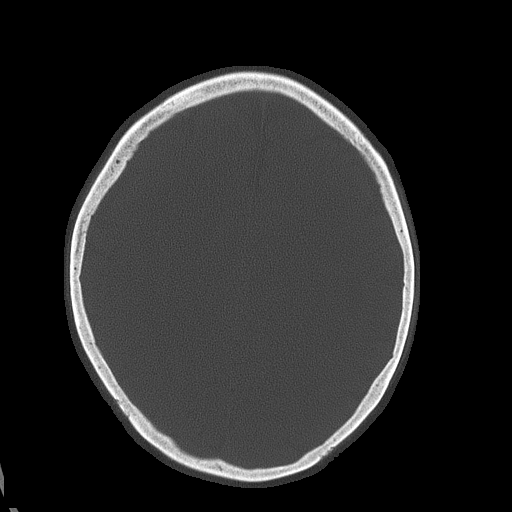
[im 58/73  bone]
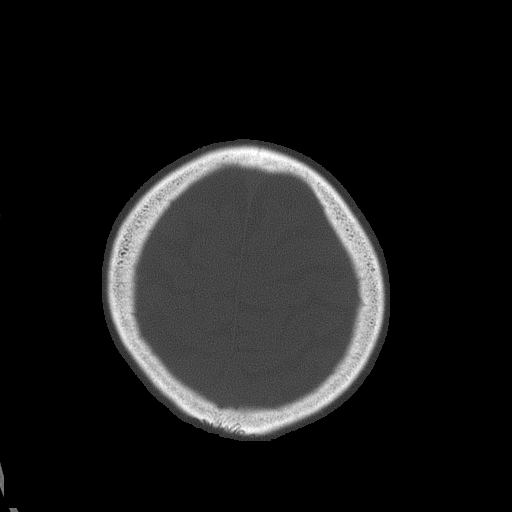

[8 of 47 positions shown; findings below may reference images not displayed]

FINDINGS: CT HEAD

Brain: No evidence of acute infarction, hemorrhage, hydrocephalus,
extra-axial collection or mass lesion/mass effect.

Vascular: No hyperdense vessel or unexpected calcification.

Skull: Normal. Negative for fracture or focal lesion.

Sinuses: Fluid level within the left sphenoid sinus.

Other: None.

CTA HEAD

Anterior circulation: No significant stenosis, proximal occlusion,
aneurysm, or vascular malformation.

Posterior circulation: No significant interval change of the
fusiform aneurysm of the proximal left intracranial vertebral
artery, just distal to the origin of the left PICA, measuring
approximately 5 mm in diameter and 7 mm in length. The intracranial
right vertebral artery, basilar artery and bilateral posterior
cerebral artery are unremarkable.

Venous sinuses: As permitted by contrast timing, patent.

Anatomic variants: None significant.

Review of the MIP images confirms the above findings.
IMPRESSION: No significant interval change of the fusiform aneurysm of the
intracranial left vertebral artery measuring approximately 5 mm in
diameter.

## 2023-09-09 ENCOUNTER — Ambulatory Visit (HOSPITAL_COMMUNITY)
Admission: EM | Admit: 2023-09-09 | Discharge: 2023-09-09 | Disposition: A | Discharge status: Home / Self Care | Attending: Emergency Medicine | Admitting: Emergency Medicine

## 2023-09-09 ENCOUNTER — Other Ambulatory Visit: Payer: Self-pay

## 2023-09-09 ENCOUNTER — Encounter (HOSPITAL_COMMUNITY): Payer: Self-pay | Admitting: Emergency Medicine

## 2023-09-09 DIAGNOSIS — J019 Acute sinusitis, unspecified: Secondary | ICD-10-CM | POA: Diagnosis not present

## 2023-09-09 MED ORDER — AMOXICILLIN-POT CLAVULANATE 875-125 MG PO TABS: 1.0000 | ORAL_TABLET | Freq: Two times a day (BID) | ORAL | 0 refills | Status: DC

## 2023-09-09 NOTE — Discharge Instructions (Signed)
 Start taking Augmentin twice daily for 7 days for sinus infection. Otherwise you can continue taking Mucinex and using nasal sprays as needed for congestion. You can take Tylenol as needed for pain. Stay hydrated and get some rest. Return here if her symptoms persist or worsen.

## 2023-09-09 NOTE — ED Triage Notes (Signed)
 Right ear fullness, has had runny nose and cough.  Using nasal spray, Claritin.  Denies headache.  Symptoms started 1-2 weeks ago.

## 2023-09-09 NOTE — ED Provider Notes (Signed)
 [facility]    CSN: [id number] Arrival date & time: 09/09/23  1520      History   Chief Complaint Chief Complaint  Patient presents with   Ear Fullness    HPI Kayla Woods is a 58 y.o. female.   Patient presents with cough, congestion, runny nose, and right ear fullness that began about 2 weeks ago.  Patient states she is having thick yellow to green mucus.  Denies fever, headache, chest pain, shortness of breath, nausea, vomiting, diarrhea, and abdominal pain.  Patient states that she has been using over-the-counter nasal spray and Claritin with minimal relief.   Ear Fullness    History reviewed. No pertinent past medical history.  There are no active problems to display for this patient.   History reviewed. No pertinent surgical history.  OB History   No obstetric history on file.      Home Medications    Prior to Admission medications   Medication Sig Start Date End Date Taking? Authorizing Provider  amoxicillin-clavulanate (AUGMENTIN) 875-125 MG tablet Take 1 tablet by mouth every 12 (twelve) hours. 09/09/23  Yes Susann Givens, Taryn Nave A, NP  cyclobenzaprine (FLEXERIL) 5 MG tablet Take 1 tablet (5 mg total) by mouth at bedtime. Patient not taking: Reported on 08/07/2016 11/15/15   Salley Scarlet, MD  ibuprofen (ADVIL,MOTRIN) 200 MG tablet Take 400 mg by mouth every 6 (six) hours as needed.    [provider]    Family History Family History  Problem Relation Age of Onset   Mental illness Sister        Bipolar    Mental illness Father        commited suicide   Heart disease Brother    Diabetes Maternal Grandfather    Hypertension Maternal Grandfather    Multiple sclerosis Daughter     Social History Social History   Tobacco Use   Smoking status: Former    Types: Cigarettes   Smokeless tobacco: Never  Vaping Use   Vaping status: Never Used  Substance Use Topics   Alcohol use: Yes    Comment: occasionally   Drug use: Yes     Types: Marijuana     Allergies   Patient has no known allergies.   Review of Systems Review of Systems  Per HPI  Physical Exam Triage Vital Signs ED Triage Vitals  Encounter Vitals Group     BP 09/09/23 1623 132/80     Systolic BP Percentile --      Diastolic BP Percentile --      Pulse Rate 09/09/23 1623 65     Resp 09/09/23 1623 18     Temp 09/09/23 1623 98.2 F (36.8 C)     Temp Source 09/09/23 1623 Oral     SpO2 09/09/23 1623 100 %     Weight --      Height --      Head Circumference --      Peak Flow --      Pain Score 09/09/23 1619 0     Pain Loc --      Pain Education --      Exclude from Growth Chart --    No data found.  Updated Vital Signs BP 132/80 (BP Location: Right Arm)   Pulse 65   Temp 98.2 F (36.8 C) (Oral)   Resp 18   SpO2 100%   Visual Acuity Right Eye Distance:   Left Eye Distance:   Bilateral Distance:  Right Eye Near:   Left Eye Near:    Bilateral Near:     Physical Exam Vitals and nursing note reviewed.  Constitutional:      General: She is awake. She is not in acute distress.    Appearance: Normal appearance. She is well-developed and well-groomed. She is not ill-appearing.  HENT:     Right Ear: Ear canal and external ear normal. Tympanic membrane is erythematous.     Left Ear: Tympanic membrane, ear canal and external ear normal.     Nose: Congestion and rhinorrhea present.     Mouth/Throat:     Mouth: Mucous membranes are moist.     Pharynx: Posterior oropharyngeal erythema present. No oropharyngeal exudate.  Cardiovascular:     Rate and Rhythm: Normal rate and regular rhythm.  Pulmonary:     Effort: Pulmonary effort is normal.     Breath sounds: Normal breath sounds.  Skin:    General: Skin is warm and dry.  Neurological:     Mental Status: She is alert.  Psychiatric:        Behavior: Behavior is cooperative.      UC Treatments / Results  Labs (all labs ordered are listed, but only abnormal results  are displayed) Labs Reviewed - No data to display  EKG   Radiology No results found.  Procedures Procedures (including critical care time)  Medications Ordered in UC Medications - No data to display  Initial Impression / Assessment and Plan / UC Course  I have reviewed the triage vital signs and the nursing notes.  Pertinent labs & imaging results that were available during my care of the patient were reviewed by me and considered in my medical decision making (see chart for details).     Upon assessment congestion and rhinorrhea present, mild erythema noted to pharynx.  Right TM is erythematous.  Prescribed Augmentin for sinusitis.  Discussed over-the-counter medication for symptoms.  Discussed return precautions Final Clinical Impressions(s) / UC Diagnoses   Final diagnoses:  Acute non-recurrent sinusitis, unspecified location     Discharge Instructions      Start taking Augmentin twice daily for 7 days for sinus infection. Otherwise you can continue taking Mucinex and using nasal sprays as needed for congestion. You can take Tylenol as needed for pain. Stay hydrated and get some rest. Return here if her symptoms persist or worsen.   ED Prescriptions     Medication Sig Dispense Auth. Provider   amoxicillin-clavulanate (AUGMENTIN) 875-125 MG tablet Take 1 tablet by mouth every 12 (twelve) hours. 14 tablet Wynonia Lawman A, NP      PDMP not reviewed this encounter.   Wynonia Lawman A, NP 09/09/23 1700

## 2023-10-29 ENCOUNTER — Other Ambulatory Visit: Payer: Self-pay | Admitting: Physician Assistant

## 2023-10-29 DIAGNOSIS — Z1231 Encounter for screening mammogram for malignant neoplasm of breast: Secondary | ICD-10-CM

## 2023-12-20 ENCOUNTER — Ambulatory Visit (HOSPITAL_COMMUNITY)
Admission: EM | Admit: 2023-12-20 | Discharge: 2023-12-20 | Disposition: A | Discharge status: Home / Self Care | Attending: Emergency Medicine | Admitting: Emergency Medicine

## 2023-12-20 ENCOUNTER — Encounter (HOSPITAL_COMMUNITY): Payer: Self-pay | Admitting: *Deleted

## 2023-12-20 ENCOUNTER — Other Ambulatory Visit: Payer: Self-pay

## 2023-12-20 DIAGNOSIS — N898 Other specified noninflammatory disorders of vagina: Secondary | ICD-10-CM | POA: Diagnosis not present

## 2023-12-20 DIAGNOSIS — R112 Nausea with vomiting, unspecified: Secondary | ICD-10-CM | POA: Diagnosis present

## 2023-12-20 DIAGNOSIS — R1032 Left lower quadrant pain: Secondary | ICD-10-CM | POA: Diagnosis present

## 2023-12-20 DIAGNOSIS — R319 Hematuria, unspecified: Secondary | ICD-10-CM | POA: Diagnosis not present

## 2023-12-20 LAB — COMPREHENSIVE METABOLIC PANEL WITH GFR
ALT: 21 U/L (ref 0–44)
AST: 28 U/L (ref 15–41)
Albumin: 4.3 g/dL (ref 3.5–5.0)
Alkaline Phosphatase: 69 U/L (ref 38–126)
Anion gap: 12 (ref 5–15)
BUN: 17 mg/dL (ref 6–20)
CO2: 28 mmol/L (ref 22–32)
Calcium: 9.5 mg/dL (ref 8.9–10.3)
Chloride: 96 mmol/L — ABNORMAL LOW (ref 98–111)
Creatinine, Ser: 0.91 mg/dL (ref 0.44–1.00)
GFR, Estimated: >60 mL/min (ref >60)
Glucose, Bld: 112 mg/dL — ABNORMAL HIGH (ref 70–99)
Potassium: 3.2 mmol/L — ABNORMAL LOW (ref 3.5–5.1)
Sodium: 136 mmol/L (ref 135–145)
Total Bilirubin: 1.1 mg/dL (ref 0.0–1.2)
Total Protein: 7.8 g/dL (ref 6.5–8.1)

## 2023-12-20 LAB — CBC WITH DIFFERENTIAL/PLATELET
Abs Immature Granulocytes: 0.04 10*3/uL (ref 0.00–0.07)
Basophils Absolute: 0.1 10*3/uL (ref 0.0–0.1)
Basophils Relative: 1 %
Eosinophils Absolute: 0 10*3/uL (ref 0.0–0.5)
Eosinophils Relative: 0 %
HCT: 48.5 % — ABNORMAL HIGH (ref 36.0–46.0)
Hemoglobin: 16.1 g/dL — ABNORMAL HIGH (ref 12.0–15.0)
Immature Granulocytes: 0 %
Lymphocytes Relative: 26 %
Lymphs Abs: 2.4 10*3/uL (ref 0.7–4.0)
MCH: 30.4 pg (ref 26.0–34.0)
MCHC: 33.2 g/dL (ref 30.0–36.0)
MCV: 91.5 fL (ref 80.0–100.0)
Monocytes Absolute: 0.8 10*3/uL (ref 0.1–1.0)
Monocytes Relative: 9 %
Neutro Abs: 6 10*3/uL (ref 1.7–7.7)
Neutrophils Relative %: 64 %
Platelets: 298 10*3/uL (ref 150–400)
RBC: 5.3 MIL/uL — ABNORMAL HIGH (ref 3.87–5.11)
RDW: 13 % (ref 11.5–15.5)
WBC: 9.3 10*3/uL (ref 4.0–10.5)
nRBC: 0 % (ref 0.0–0.2)

## 2023-12-20 LAB — POCT URINALYSIS DIP (MANUAL ENTRY)
Glucose, UA: NEGATIVE mg/dL
Leukocytes, UA: NEGATIVE
Nitrite, UA: NEGATIVE
Protein Ur, POC: >=300 mg/dL — AB
Spec Grav, UA: 1.025 (ref 1.010–1.025)
Urobilinogen, UA: 1 U/dL
pH, UA: 6 (ref 5.0–8.0)

## 2023-12-20 LAB — LIPASE, BLOOD: Lipase: 25 U/L (ref 11–51)

## 2023-12-20 MED ORDER — METRONIDAZOLE 500 MG PO TABS: 500.0000 mg | ORAL_TABLET | Freq: Two times a day (BID) | ORAL | 0 refills | Status: AC

## 2023-12-20 MED ORDER — TAMSULOSIN HCL 0.4 MG PO CAPS: 0.4000 mg | ORAL_CAPSULE | Freq: Every day | ORAL | 0 refills | Status: AC

## 2023-12-20 MED ORDER — ONDANSETRON 4 MG PO TBDP: 4.0000 mg | ORAL_TABLET | Freq: Three times a day (TID) | ORAL | 0 refills | Status: AC | PRN

## 2023-12-20 NOTE — ED Triage Notes (Signed)
 C/O intermittent LLQ abd discomfort with vomiting and vaginal discharge onset 3 days ago. States able to keep down some soup; has been taking sea moss. Denies fevers.

## 2023-12-20 NOTE — ED Provider Notes (Signed)
 [facility]    CSN: [id number] Arrival date & time: 12/20/23  0809      History   Chief Complaint Chief Complaint  Patient presents with   Abdominal Pain   Vaginal Discharge    HPI Kayla Woods is a 58 y.o. female.   Patient presents with intermittent left lower quadrant pain that she describes a discomfort that began on 6/27.  Patient states that she has also had intermittent vomiting that mainly occurs with the discomfort also occurs. Patient states that she has had some intermittent nausea and states that she has had a difficult time eating anything because of this.  Patient states that she has been able to keep down some soup and water.  Patient states that she did have some mild diarrhea towards the beginning of her symptoms, but states that she has not had this since.  Denies dysuria, hematuria, urinary frequency/urgency, flank pain, fever, body aches, weakness, vaginal bleeding, and vaginal pain.  Patient also endorses vaginal discharge that began on 6/27 as well.  Patient describes the vaginal discharge as malodorous.  Patient states that she is not sexually active at this time.  Patient is postmenopausal and therefore does not have menstrual cycles.  The history is provided by the patient and medical records.  Abdominal Pain Associated symptoms: vaginal discharge   Vaginal Discharge Associated symptoms: abdominal pain     History reviewed. No pertinent past medical history.  There are no active problems to display for this patient.   History reviewed. No pertinent surgical history.  OB History   No obstetric history on file.      Home Medications    Prior to Admission medications   Medication Sig Start Date End Date Taking? Authorizing Provider  ibuprofen (ADVIL,MOTRIN) 200 MG tablet Take 400 mg by mouth every 6 (six) hours as needed.   Yes [provider]  metroNIDAZOLE  (FLAGYL ) 500 MG tablet Take 1 tablet (500 mg total) by mouth 2  (two) times daily. 12/20/23  Yes Johnie Flaming A, NP  ondansetron  (ZOFRAN -ODT) 4 MG disintegrating tablet Take 1 tablet (4 mg total) by mouth every 8 (eight) hours as needed for nausea or vomiting. 12/20/23  Yes Johnie Flaming A, NP  tamsulosin (FLOMAX) 0.4 MG CAPS capsule Take 1 capsule (0.4 mg total) by mouth daily. 12/20/23  Yes Johnie Flaming LABOR, NP    Family History Family History  Problem Relation Age of Onset   Mental illness Sister        Bipolar    Mental illness Father        commited suicide   Heart disease Brother    Diabetes Maternal Grandfather    Hypertension Maternal Grandfather    Multiple sclerosis Daughter     Social History Social History   Tobacco Use   Smoking status: Some Days    Types: Cigarettes   Smokeless tobacco: Never  Vaping Use   Vaping status: Never Used  Substance Use Topics   Alcohol use: Not Currently   Drug use: Yes    Types: Marijuana     Allergies   Patient has no known allergies.   Review of Systems Review of Systems  Gastrointestinal:  Positive for abdominal pain.  Genitourinary:  Positive for vaginal discharge.   Per HPI  Physical Exam Triage Vital Signs ED Triage Vitals [12/20/23 0844]  Encounter Vitals Group     BP 129/86     Girls Systolic BP Percentile      Girls  Diastolic BP Percentile      Boys Systolic BP Percentile      Boys Diastolic BP Percentile      Pulse Rate 96     Resp 16     Temp 98.3 F (36.8 C)     Temp Source Oral     SpO2 95 %     Weight      Height      Head Circumference      Peak Flow      Pain Score 7     Pain Loc      Pain Education      Exclude from Growth Chart    No data found.  Updated Vital Signs BP 129/86   Pulse 96   Temp 98.3 F (36.8 C) (Oral)   Resp 16   LMP 08/18/2015 Comment: irregular  SpO2 95%   Visual Acuity Right Eye Distance:   Left Eye Distance:   Bilateral Distance:    Right Eye Near:   Left Eye Near:    Bilateral Near:     Physical  Exam Vitals and nursing note reviewed.  Constitutional:      General: She is awake. She is not in acute distress.    Appearance: Normal appearance. She is well-developed and well-groomed. She is not ill-appearing.   Cardiovascular:     Rate and Rhythm: Normal rate and regular rhythm.  Pulmonary:     Effort: Pulmonary effort is normal.     Breath sounds: Normal breath sounds.  Abdominal:     General: Abdomen is flat. Bowel sounds are normal. There is no distension.     Palpations: Abdomen is soft.     Tenderness: There is abdominal tenderness in the left lower quadrant. There is no right CVA tenderness, left CVA tenderness, guarding or rebound.     Comments: Mild tenderness noted to LLQ  Genitourinary:    Comments: Exam deferred  Skin:    General: Skin is warm and dry.   Neurological:     Mental Status: She is alert.   Psychiatric:        Behavior: Behavior is cooperative.      UC Treatments / Results  Labs (all labs ordered are listed, but only abnormal results are displayed) Labs Reviewed  POCT URINALYSIS DIP (MANUAL ENTRY) - Abnormal; Notable for the following components:      Result Value   Color, UA other (*)    Bilirubin, UA large (*)    Ketones, POC UA small (15) (*)    Blood, UA large (*)    Protein Ur, POC >=300 (*)    All other components within normal limits  URINE CULTURE  CBC WITH DIFFERENTIAL/PLATELET  COMPREHENSIVE METABOLIC PANEL WITH GFR  LIPASE, BLOOD  CERVICOVAGINAL ANCILLARY ONLY    EKG   Radiology No results found.  Procedures Procedures (including critical care time)  Medications Ordered in UC Medications - No data to display  Initial Impression / Assessment and Plan / UC Course  I have reviewed the triage vital signs and the nursing notes.  Pertinent labs & imaging results that were available during my care of the patient were reviewed by me and considered in my medical decision making (see chart for details).     Patient is  overall well-appearing.  Vitals are stable.  Upon assessment there is mild tenderness noted to LLQ.  No other significant findings upon exam.  GU exam deferred.  Patient perform self swab for STD/STI.  Deferred HIV and RPR due to patient stating that she is not sexually active at this time.  Urinalysis revealed large bilirubin, large RBCs, large amounts of protein, and small ketones.  Will send urine culture due to presence of hematuria.  Ordered CBC, CMP, and lipase to evaluate for underlying causes related to abdominal pain and to rule out electrolyte imbalance due to recent vomiting.  Patient metronidazole  for bacterial vaginosis coverage.  Prescribed tamsulosin due to suspicion for presence of kidney stone with LLQ pain and presence of hematuria.  Prescribed Zofran  as needed for nausea and vomiting.  Discussed follow-up, return, and strict ER precautions. Final Clinical Impressions(s) / UC Diagnoses   Final diagnoses:  Left lower quadrant abdominal pain  Vaginal discharge  Hematuria, unspecified type  Nausea and vomiting, unspecified vomiting type     Discharge Instructions      Start taking metronidazole  twice daily for 7 days for bacterial vaginosis coverage. Your swab results will return over the next few days and someone will call if results are positive and require any additional treatment.  Start taking tamsulosin once daily by mouth in the event that your abdominal pain and microscopic blood in your urine is related to the presence of a kidney stone. Your urine culture will return over the next few days and if these results are positive someone will call and adjust treatment if needed.  We have also drawn some lab work today.  If any of these results are concerning someone will give you a call and advise any additional treatment at that time.  I have prescribed Zofran  that you can take every 8 hours as needed for nausea and vomiting.  This will dissolve under your tongue.  Be  sure you are staying hydrated.  Work your way back up to your regular diet as tolerated.  Follow-up with your primary care provider or return here as needed.  [facility] also [facility] you [facility] with if you continue to have issues with vaginal discharge.  If you develop severe abdominal pain, excessive vomiting, blood in vomit or stool, fevers unrelieved by medication, weakness, or passing out please seek immediate medical treatment in the emergency department.    ED Prescriptions     Medication Sig Dispense Auth. Provider   metroNIDAZOLE  (FLAGYL ) 500 MG tablet Take 1 tablet (500 mg total) by mouth 2 (two) times daily. 14 tablet Johnie Flaming A, NP   tamsulosin (FLOMAX) 0.4 MG CAPS capsule Take 1 capsule (0.4 mg total) by mouth daily. 10 capsule Johnie Flaming A, NP   ondansetron  (ZOFRAN -ODT) 4 MG disintegrating tablet Take 1 tablet (4 mg total) by mouth every 8 (eight) hours as needed for nausea or vomiting. 10 tablet Johnie Flaming A, NP      PDMP not reviewed this encounter.   Johnie Flaming A, [place] 12/20/23 [phone]

## 2023-12-20 NOTE — Discharge Instructions (Addendum)
 Start taking metronidazole  twice daily for 7 days for bacterial vaginosis coverage. Your swab results will return over the next few days and someone will call if results are positive and require any additional treatment.  Start taking tamsulosin once daily by mouth in the event that your abdominal pain and microscopic blood in your urine is related to the presence of a kidney stone. Your urine culture will return over the next few days and if these results are positive someone will call and adjust treatment if needed.  We have also drawn some lab work today.  If any of these results are concerning someone will give you a call and advise any additional treatment at that time.  I have prescribed Zofran  that you can take every 8 hours as needed for nausea and vomiting.  This will dissolve under your tongue.  Be sure you are staying hydrated.  Work your way back up to your regular diet as tolerated.  Follow-up with your primary care provider or return here as needed.  I have also attached the [facility] that you can follow-up with if you continue to have issues with vaginal discharge.  If you develop severe abdominal pain, excessive vomiting, blood in vomit or stool, fevers unrelieved by medication, weakness, or passing out please seek immediate medical treatment in the emergency department.

## 2023-12-21 ENCOUNTER — Ambulatory Visit (HOSPITAL_COMMUNITY): Payer: Self-pay

## 2023-12-21 LAB — CERVICOVAGINAL ANCILLARY ONLY
Comment: NEGATIVE
Comment: NEGATIVE
Comment: NEGATIVE
Comment: NEGATIVE
Comment: NEGATIVE
Comment: NORMAL

## 2023-12-21 LAB — URINE CULTURE

## 2023-12-22 MED ORDER — POTASSIUM CHLORIDE CRYS ER 20 MEQ PO TBCR: 20.0000 meq | EXTENDED_RELEASE_TABLET | Freq: Once | ORAL | 0 refills | Status: AC

## 2023-12-23 ENCOUNTER — Ambulatory Visit (HOSPITAL_COMMUNITY)
Admission: EM | Admit: 2023-12-23 | Discharge: 2023-12-23 | Disposition: A | Discharge status: Home / Self Care | Attending: Family Medicine | Admitting: Family Medicine

## 2023-12-23 DIAGNOSIS — N898 Other specified noninflammatory disorders of vagina: Secondary | ICD-10-CM | POA: Diagnosis present

## 2023-12-23 DIAGNOSIS — R1032 Left lower quadrant pain: Secondary | ICD-10-CM | POA: Diagnosis not present

## 2023-12-23 NOTE — ED Notes (Signed)
 Verified label, foil cap intact and liquid in transport medium

## 2023-12-23 NOTE — ED Notes (Signed)
 Patient was called and instructed to return for a recollect on cyto swab due to liquid being spilled.  Patient given instructions, labeled at bedside.

## 2023-12-27 LAB — CERVICOVAGINAL ANCILLARY ONLY
Bacterial Vaginitis (gardnerella): POSITIVE — AB
Candida Glabrata: NEGATIVE
Candida Vaginitis: NEGATIVE
Chlamydia: NEGATIVE
Comment: NEGATIVE
Comment: NEGATIVE
Comment: NEGATIVE
Comment: NEGATIVE
Comment: NEGATIVE
Comment: NORMAL
Neisseria Gonorrhea: NEGATIVE
Trichomonas: POSITIVE — AB

## 2023-12-28 ENCOUNTER — Ambulatory Visit (HOSPITAL_COMMUNITY): Payer: Self-pay
